# Patient Record
Sex: Male | Born: 1975 | Race: Black or African American | Hispanic: No | Marital: Married | Smoking: Current every day smoker
Health system: Southern US, Community
[De-identification: ages and names within clinical notes are randomized; demographics above are authoritative.]

## PROBLEM LIST (undated history)

## (undated) DIAGNOSIS — M5126 Other intervertebral disc displacement, lumbar region: Secondary | ICD-10-CM

## (undated) DIAGNOSIS — I10 Essential (primary) hypertension: Secondary | ICD-10-CM

## (undated) HISTORY — PX: NO PAST SURGERIES: SHX2092

---

## 2016-03-31 ENCOUNTER — Ambulatory Visit: Payer: Self-pay | Admitting: Orthopedic Surgery

## 2016-03-31 NOTE — H&P (Signed)
Valen Gillison is an 40 y.o. male.   Chief Complaint: back and L leg pain HPI: The patient is a 40 year old male who presents with back pain. The patient reports low back symptoms including pain which began 6 week(s) (6 days) ago following a specific injury (stepped off of a forklift. DOI 01/19/16). The pain radiates to the left thigh. The patient describes the pain as aching. The patient describes the severity of their symptoms as moderate in severity. Current treatment includes opioid analgesics (Hydrocodone) and muscle relaxants (Cyclobenzaprine and Lyrica prn). Prior to being seen today the patient was previously evaluated by Raenette Rover, PA-C with [facility]. Past evaluation has included MRI of the lumbar spine. Past treatment has included opioid analgesics, muscle relaxants, corticosteroids, activity modification, ice packs and heating pad. Note for "Back pain": The patient states that he did experience left low back, buttock, and left leg pain initially, but the medication helped with most of that pain. The patient is out of work. NCM Angelia Smith.  Khylen Riolo is seven weeks status post an injury. He presents here with Karl Ito, his case manager. He is apparently at work and he was lifting. He was getting off his forklift, twisted it, had acute pain that radiated down into his leg. He was seen by Raenette Rover, PA at [facility] in [place]. He was given a prednisone pack and it was diagnosed with sciatica. Due to persistent symptoms, he was directly sent for an MRI which demonstrated a moderately large foraminal disc protrusion at 4-5 with compression of the four root. Facet arthritis was noted at 4-5. This is at [facility] radiology. He was referred for surgical evaluation as he had a persistent weakness. He has noted weakness, numbness and atrophy of his leg. He presents here with his wife and Senaida Lange, his case Production designer, theatre/television/film.  REVIEW OF SYSTEMS Review of  systems is negative for fevers, chest pain, shortness of breath, unexplained recent weight loss, loss of bowel or bladder function, burning with urination, joint swelling, rashes, weakness or numbness, difficulty with balance, easy bruising, excessive thirst or frequent urination.  He had a history of back injury in the past, but it was resolved. He was working. He has been there for a long period of time. He is quite pleased with his job.  Past Medical Hx Gout  High blood pressure   Allergies (April C. Webb; 03/07/2016 9:35 AM) No Known Drug Allergies [03/07/2016]:  Family History (April C. Webb; 03/07/2016 9:25 AM) Diabetes Mellitus  Brother, Father. Hypertension  Brother, Mother.  Social History (April C. Webb; 03/07/2016 9:25 AM) Children  5 or more Current drinker  03/07/2016: Currently drinks beer only occasionally per week Current work status  working full time Exercise  Exercises daily; does running / walking Living situation  live with spouse Marital status  married No history of drug/alcohol rehab  Not under pain contract  Number of flights of stairs before winded  greater than 5 Tobacco / smoke exposure  03/07/2016: yes outdoors only Tobacco use  Current every day smoker. 03/07/2016: smoke(d) 1 pack(s) per day  Medication History (April C. Webb; 03/07/2016 9:43 AM) Labetalol HCl (100MG  Tablet, Oral) Active. Hydrocodone-Acetaminophen (5-325MG  Tablet, Oral) Active. Cyclobenzaprine HCl (5MG  Tablet, Oral) Active. Lyrica (Oral) Specific strength unknown - Active.  No pertinent surgical hx Review of Systems  Constitutional: Negative.   HENT: Negative.   Eyes: Negative.   Respiratory: Negative.   Cardiovascular: Negative.   Gastrointestinal: Negative.  Genitourinary: Negative.   Musculoskeletal: Positive for back pain.  Skin: Negative.   Neurological: Positive for sensory change and focal weakness.  Psychiatric/Behavioral: Negative.     There were  no vitals taken for this visit. Physical Exam  Constitutional: He is oriented to person, place, and time. He appears well-developed and well-nourished. He appears distressed.  HENT:  Head: Normocephalic.  Eyes: Pupils are equal, round, and reactive to light.  Neck: Normal range of motion.  Cardiovascular: Normal rate.   Respiratory: Effort normal.  GI: Soft.  Musculoskeletal:  On exam, moderate distress. Walks with an antalgic gait. Mood and affect is appropriate. He has visible atrophy of the left quad. He has an absent knee reflex, decreased sensation in L4 dermatome. Hip flexor weakness as well as quad weakness. Tibialis anterior is slightly weak. Global limited range of motion lumbar spine.  Lumbar spine exam reveals no evidence of soft tissue swelling, deformity or skin ecchymosis. On palpation there is no tenderness of the lumbar spine. No flank pain with percussion. The abdomen is soft and nontender. Nontender over the trochanters. No cellulitis or lymphadenopathy.  Good range of motion of the lumbar spine without associated pain. Straight leg raise is negative. Motor is 5/5 including EHL, tibialis anterior, plantar flexion, quadriceps and hamstrings. Patient is normoreflexic. There is no Babinski or clonus. Sensory exam is intact to light touch. Patient has good distal pulses. No DVT. No pain and normal range of motion without instability of the hips, knees and ankles.  Neurological: He is alert and oriented to person, place, and time. He displays abnormal reflex.    Three view radiographs, AP, lateral, flexion extension demonstrates a mild scoliosis. Slight offset of 4-5 which may be in rotation without instability in flexion and extension.  [facility] demonstrates a foraminal disc herniation L4-5 compressing the four root due to facet hypertrophy.  The patient also has tried physical therapy at seven weeks status post.  Assessment/Plan HNP L4-5  left Subacute L4 radiculopathy with a severe myotomal weakness and dermatomal dysesthesias, quad atrophy. He has quad atrophy approximately 5 cm less on the affected side than the right.  I had an extensive discussion concerning his current pathology, relevant anatomy and treatment options. His pain is less. I do feel it is secondary to significant compression of the four root. With his severe quadriceps atrophy, weakness, absent reflex, numbness at seven weeks status post and the presence of a neural compressive lesion, I have recommended urgent consideration of decompression, micro lumbar decompression at L4-5 in the left.  I had an extensive discussion of the risks and benefits of the lumbar decompression with the patient including bleeding, infection, damage to neurovascular structures, epidural fibrosis, CSF leak requiring repair. We also discussed increase in pain, adjacent segment disease, recurrent disc herniation, need for future surgery including repeat decompression and/or fusion. We also discussed risks of postoperative hematoma, paralysis, anesthetic complications including DVT, PE, death, cardiopulmonary dysfunction. In addition, the perioperative and postoperative courses were discussed in detail including the rehabilitative time and return to functional activity and work. I provided the patient with an illustrated handout and utilized the appropriate surgical models.  He has no history of DVT, myocardial infarction, diabetes. No history of CVA. Taking hydrocodone, cyclobenzaprine. No back pain or no problems prior to that. We discussed the possibly of the recurrent disc herniation, possibility of fusion in the future. In the interim, I would recommend that he stay out as he has severe weakness and  the risk for his quad giving out and injury. I think this is high and I feel it would be a significant risk factor for that. He would like to proceed as soon as possible. I appreciate kind referral  by Karl Ito, his case manager. We would proceed as soon as possible.  We specifically discussed the possibility that this may have continued pain and extended recovery given the given the extent of that at this point in time, normally overnight in the hospital, two weeks of suture removal, six weeks of physical therapy and then possibility of a light duty. Normally three months of maximum medical improvement pending upon the recovery of the nerve root. Hopefully, we would get some recovery and return of his quadriceps function. I appreciate the kind referral.  Plan microlumbar decompression L4-5 left  Mavis Gravelle, Dayna Barker., PA-C for Dr Shelle Iron 03/31/2016, 11:48 AM

## 2016-04-04 ENCOUNTER — Ambulatory Visit (HOSPITAL_COMMUNITY)
Admission: RE | Admit: 2016-04-04 | Discharge: 2016-04-04 | Disposition: A | Discharge status: Home / Self Care | Payer: Worker's Compensation | Source: Ambulatory Visit | Attending: Orthopedic Surgery | Admitting: Orthopedic Surgery

## 2016-04-04 ENCOUNTER — Encounter (HOSPITAL_COMMUNITY)
Admission: RE | Admit: 2016-04-04 | Discharge: 2016-04-04 | Disposition: A | Discharge status: Home / Self Care | Payer: Worker's Compensation | Source: Ambulatory Visit | Attending: Specialist | Admitting: Specialist

## 2016-04-04 ENCOUNTER — Encounter (HOSPITAL_COMMUNITY): Payer: Self-pay

## 2016-04-04 DIAGNOSIS — M4696 Unspecified inflammatory spondylopathy, lumbar region: Secondary | ICD-10-CM | POA: Diagnosis not present

## 2016-04-04 DIAGNOSIS — F17200 Nicotine dependence, unspecified, uncomplicated: Secondary | ICD-10-CM | POA: Diagnosis not present

## 2016-04-04 DIAGNOSIS — I10 Essential (primary) hypertension: Secondary | ICD-10-CM | POA: Diagnosis not present

## 2016-04-04 DIAGNOSIS — M5126 Other intervertebral disc displacement, lumbar region: Secondary | ICD-10-CM

## 2016-04-04 DIAGNOSIS — Z79891 Long term (current) use of opiate analgesic: Secondary | ICD-10-CM | POA: Diagnosis not present

## 2016-04-04 DIAGNOSIS — M4806 Spinal stenosis, lumbar region: Secondary | ICD-10-CM | POA: Diagnosis not present

## 2016-04-04 DIAGNOSIS — M2578 Osteophyte, vertebrae: Secondary | ICD-10-CM | POA: Diagnosis not present

## 2016-04-04 DIAGNOSIS — Z79899 Other long term (current) drug therapy: Secondary | ICD-10-CM | POA: Diagnosis not present

## 2016-04-04 HISTORY — DX: Other intervertebral disc displacement, lumbar region: M51.26

## 2016-04-04 HISTORY — DX: Essential (primary) hypertension: I10

## 2016-04-04 LAB — BASIC METABOLIC PANEL
Anion gap: 6 (ref 5–15)
BUN: 20 mg/dL (ref 6–20)
CALCIUM: 9.9 mg/dL (ref 8.9–10.3)
CO2: 27 mmol/L (ref 22–32)
CREATININE: 1.07 mg/dL (ref 0.61–1.24)
Chloride: 104 mmol/L (ref 101–111)
GFR calc non Af Amer: >60 mL/min (ref >60)
GLUCOSE: 112 mg/dL — AB (ref 65–99)
Potassium: 4.4 mmol/L (ref 3.5–5.1)
Sodium: 137 mmol/L (ref 135–145)

## 2016-04-04 LAB — SURGICAL PCR SCREEN
MRSA, PCR: NEGATIVE
Staphylococcus aureus: NEGATIVE

## 2016-04-04 LAB — CBC
HCT: 45.4 % (ref 39.0–52.0)
Hemoglobin: 14.9 g/dL (ref 13.0–17.0)
MCH: 29.3 pg (ref 26.0–34.0)
MCHC: 32.8 g/dL (ref 30.0–36.0)
MCV: 89.2 fL (ref 78.0–100.0)
PLATELETS: 239 10*3/uL (ref 150–400)
RBC: 5.09 MIL/uL (ref 4.22–5.81)
RDW: 12.8 % (ref 11.5–15.5)
WBC: 6.7 10*3/uL (ref 4.0–10.5)

## 2016-04-04 LAB — ABO/RH: ABO/RH(D): B POS

## 2016-04-04 NOTE — Patient Instructions (Addendum)
Collin Lloyd  04/04/2016   Your procedure is scheduled on: 04-05-16  [facility]  [facility] 3rd floor to  Short Stay Center at 900 AM.  Call this number if you have problems the morning of surgery [phone]   Remember: ONLY 1 PERSON MAY GO WITH YOU TO SHORT STAY TO GET  READY MORNING OF YOUR SURGERY.  Do not eat food or drink liquids :After Midnight.     Take these medicines the morning of surgery with A SIP OF WATER: Labetalol, gemfibrozil              You may not have any metal on your body including hair pins and              piercings  Do not wear jewelry, make-up, lotions, powders or perfumes, deodorant             Do not wear nail polish.  Do not shave  48 hours prior to surgery.              Men may shave face and neck.   Do not bring valuables to the hospital. [place] IS NOT             RESPONSIBLE   FOR VALUABLES.  Contacts, dentures or bridgework may not be worn into surgery.  Leave suitcase in the car. After surgery it may be brought to your room.                Please read over the following fact sheets you were given: _____________________________________________________________________             [facility] - Preparing for Surgery Before surgery, you can play an important role.  Because skin is not sterile, your skin needs to be as free of germs as possible.  You can reduce the number of germs on your skin by washing with CHG (chlorahexidine gluconate) soap before surgery.  CHG is an antiseptic cleaner which kills germs and bonds with the skin to continue killing germs even after washing. Please DO NOT use if you have an allergy to CHG or antibacterial soaps.  If your skin becomes reddened/irritated stop using the CHG and inform your nurse when you arrive at Short Stay. Do not shave (including legs and underarms) for at least 48 hours prior to the first CHG shower.  You may shave your  face/neck. Please follow these instructions carefully:  1.  Shower with CHG Soap the night before surgery and the  morning of Surgery.  2.  If you choose to wash your hair, wash your hair first as usual with your  normal  shampoo.  3.  After you shampoo, rinse your hair and body thoroughly to remove the  shampoo.                           4.  Use CHG as you would any other liquid soap.  You can apply chg directly  to the skin and wash                       Gently with a scrungie or clean washcloth.  5.  Apply the CHG Soap to your body ONLY FROM THE NECK DOWN.   Do not use on face/ open  Wound or open sores. Avoid contact with eyes, ears mouth and genitals (private parts).                       Wash face,  Genitals (private parts) with your normal soap.             6.  Wash thoroughly, paying special attention to the area where your surgery  will be performed.  7.  Thoroughly rinse your body with warm water from the neck down.  8.  DO NOT shower/wash with your normal soap after using and rinsing off  the CHG Soap.                9.  Pat yourself dry with a clean towel.            10.  Wear clean pajamas.            11.  Place clean sheets on your bed the night of your first shower and do not  sleep with pets. Day of Surgery : Do not apply any lotions/deodorants the morning of surgery.  Please wear clean clothes to the hospital/surgery center.  FAILURE TO FOLLOW THESE INSTRUCTIONS MAY RESULT IN THE CANCELLATION OF YOUR SURGERY PATIENT SIGNATURE_________________________________  NURSE SIGNATURE__________________________________  ________________________________________________________________________   Adam Phenix  An incentive spirometer is a tool that can help keep your lungs clear and active. This tool measures how well you are filling your lungs with each breath. Taking long deep breaths may help reverse or decrease the chance of developing breathing  (pulmonary) problems (especially infection) following:  A long period of time when you are unable to move or be active. BEFORE THE PROCEDURE   If the spirometer includes an indicator to show your best effort, your nurse or respiratory therapist will set it to a desired goal.  If possible, sit up straight or lean slightly forward. Try not to slouch.  Hold the incentive spirometer in an upright position. INSTRUCTIONS FOR USE  1. Sit on the edge of your bed if possible, or sit up as far as you can in bed or on a chair. 2. Hold the incentive spirometer in an upright position. 3. Breathe out normally. 4. Place the mouthpiece in your mouth and seal your lips tightly around it. 5. Breathe in slowly and as deeply as possible, raising the piston or the ball toward the top of the column. 6. Hold your breath for 3-5 seconds or for as long as possible. Allow the piston or ball to fall to the bottom of the column. 7. Remove the mouthpiece from your mouth and breathe out normally. 8. Rest for a few seconds and repeat Steps 1 through 7 at least 10 times every 1-2 hours when you are awake. Take your time and take a few normal breaths between deep breaths. 9. The spirometer may include an indicator to show your best effort. Use the indicator as a goal to work toward during each repetition. 10. After each set of 10 deep breaths, practice coughing to be sure your lungs are clear. If you have an incision (the cut made at the time of surgery), support your incision when coughing by placing a pillow or rolled up towels firmly against it. Once you are able to get out of bed, walk around indoors and cough well. You may stop using the incentive spirometer when instructed by your caregiver.  RISKS AND COMPLICATIONS  Take your time so you do not get  dizzy or light-headed.  If you are in pain, you may need to take or ask for pain medication before doing incentive spirometry. It is harder to take a deep breath if you  are having pain. AFTER USE  Rest and breathe slowly and easily.  It can be helpful to keep track of a log of your progress. Your caregiver can provide you with a simple table to help with this. If you are using the spirometer at home, follow these instructions: [place] IF:   You are having difficultly using the spirometer.  You have trouble using the spirometer as often as instructed.  Your pain medication is not giving enough relief while using the spirometer.  You develop fever of 100.5 F (38.1 C) or higher. SEEK IMMEDIATE MEDICAL CARE IF:   You cough up bloody sputum that had not been present before.  You develop fever of 102 F (38.9 C) or greater.  You develop worsening pain at or near the incision site. MAKE SURE YOU:   Understand these instructions.  Will watch your condition.  Will get help right away if you are not doing well or get worse. Document Released: 11/20/2006 Document Revised: 10/02/2011 Document Reviewed: 01/21/2007 [facility] Patient Information 2014 [place], [place].   ________________________________________________________________________

## 2016-04-05 ENCOUNTER — Ambulatory Visit (HOSPITAL_COMMUNITY): Payer: Worker's Compensation

## 2016-04-05 ENCOUNTER — Encounter (HOSPITAL_COMMUNITY): Payer: Self-pay | Admitting: *Deleted

## 2016-04-05 ENCOUNTER — Ambulatory Visit (HOSPITAL_COMMUNITY)
Admission: RE | Admit: 2016-04-05 | Discharge: 2016-04-06 | Disposition: A | Discharge status: Home / Self Care | Payer: Worker's Compensation | Source: Ambulatory Visit | Attending: Specialist | Admitting: Specialist

## 2016-04-05 ENCOUNTER — Ambulatory Visit (HOSPITAL_COMMUNITY): Payer: Worker's Compensation | Admitting: Certified Registered Nurse Anesthetist

## 2016-04-05 ENCOUNTER — Encounter (HOSPITAL_COMMUNITY)
Admission: RE | Disposition: A | Discharge status: Home / Self Care | Payer: Self-pay | Source: Ambulatory Visit | Attending: Specialist

## 2016-04-05 DIAGNOSIS — F17200 Nicotine dependence, unspecified, uncomplicated: Secondary | ICD-10-CM | POA: Insufficient documentation

## 2016-04-05 DIAGNOSIS — M2578 Osteophyte, vertebrae: Secondary | ICD-10-CM | POA: Diagnosis not present

## 2016-04-05 DIAGNOSIS — M4696 Unspecified inflammatory spondylopathy, lumbar region: Secondary | ICD-10-CM | POA: Diagnosis not present

## 2016-04-05 DIAGNOSIS — Z79899 Other long term (current) drug therapy: Secondary | ICD-10-CM | POA: Insufficient documentation

## 2016-04-05 DIAGNOSIS — M4806 Spinal stenosis, lumbar region: Secondary | ICD-10-CM | POA: Diagnosis not present

## 2016-04-05 DIAGNOSIS — M48061 Spinal stenosis, lumbar region without neurogenic claudication: Secondary | ICD-10-CM | POA: Diagnosis present

## 2016-04-05 DIAGNOSIS — Z9889 Other specified postprocedural states: Secondary | ICD-10-CM

## 2016-04-05 DIAGNOSIS — M5126 Other intervertebral disc displacement, lumbar region: Secondary | ICD-10-CM | POA: Diagnosis not present

## 2016-04-05 DIAGNOSIS — I10 Essential (primary) hypertension: Secondary | ICD-10-CM | POA: Insufficient documentation

## 2016-04-05 DIAGNOSIS — Z419 Encounter for procedure for purposes other than remedying health state, unspecified: Secondary | ICD-10-CM

## 2016-04-05 DIAGNOSIS — Z79891 Long term (current) use of opiate analgesic: Secondary | ICD-10-CM | POA: Insufficient documentation

## 2016-04-05 HISTORY — PX: LUMBAR LAMINECTOMY/DECOMPRESSION MICRODISCECTOMY: SHX5026

## 2016-04-05 LAB — TYPE AND SCREEN
ABO/RH(D): B POS
Antibody Screen: NEGATIVE

## 2016-04-05 SURGERY — LUMBAR LAMINECTOMY/DECOMPRESSION MICRODISCECTOMY
Anesthesia: General | Site: Back | Laterality: Left

## 2016-04-05 MED ORDER — CEFAZOLIN SODIUM-DEXTROSE 2-4 GM/100ML-% IV SOLN
2.0000 g | INTRAVENOUS | Status: AC
  Administered 2016-04-05: 2 g via INTRAVENOUS
  Filled 2016-04-05: qty 100

## 2016-04-05 MED ORDER — SODIUM CHLORIDE 0.9 % IV SOLN
INTRAVENOUS | Status: DC | PRN
  Administered 2016-04-05: 50 ug/min via INTRAVENOUS

## 2016-04-05 MED ORDER — SODIUM CHLORIDE 0.9 % IR SOLN
Status: AC
  Filled 2016-04-05: qty 1

## 2016-04-05 MED ORDER — MEPERIDINE HCL 50 MG/ML IJ SOLN: 6.2500 mg | INTRAMUSCULAR | Status: DC | PRN

## 2016-04-05 MED ORDER — RISAQUAD PO CAPS
1.0000 | ORAL_CAPSULE | Freq: Every day | ORAL | Status: DC
  Administered 2016-04-06: 1 via ORAL
  Filled 2016-04-05: qty 1

## 2016-04-05 MED ORDER — CEFAZOLIN SODIUM-DEXTROSE 2-4 GM/100ML-% IV SOLN
2.0000 g | Freq: Three times a day (TID) | INTRAVENOUS | Status: DC
  Administered 2016-04-05 – 2016-04-06 (×2): 2 g via INTRAVENOUS
  Filled 2016-04-05 (×2): qty 100

## 2016-04-05 MED ORDER — EPHEDRINE SULFATE 50 MG/ML IJ SOLN
INTRAMUSCULAR | Status: DC | PRN
  Administered 2016-04-05 (×5): 10 mg via INTRAVENOUS

## 2016-04-05 MED ORDER — LIP MEDEX EX OINT
TOPICAL_OINTMENT | CUTANEOUS | Status: AC
  Filled 2016-04-05: qty 7

## 2016-04-05 MED ORDER — ONDANSETRON HCL 4 MG/2ML IJ SOLN
INTRAMUSCULAR | Status: AC
  Filled 2016-04-05: qty 2

## 2016-04-05 MED ORDER — LIDOCAINE 2% (20 MG/ML) 5 ML SYRINGE
INTRAMUSCULAR | Status: AC
  Filled 2016-04-05: qty 5

## 2016-04-05 MED ORDER — AMLODIPINE BESYLATE 10 MG PO TABS
10.0000 mg | ORAL_TABLET | Freq: Once | ORAL | Status: AC
  Administered 2016-04-05: 10 mg via ORAL
  Filled 2016-04-05 (×2): qty 1

## 2016-04-05 MED ORDER — MAGNESIUM CITRATE PO SOLN: 1.0000 | Freq: Once | ORAL | Status: DC | PRN

## 2016-04-05 MED ORDER — EPHEDRINE 5 MG/ML INJ
INTRAVENOUS | Status: AC
  Filled 2016-04-05: qty 20

## 2016-04-05 MED ORDER — LABETALOL HCL 200 MG PO TABS
200.0000 mg | ORAL_TABLET | Freq: Two times a day (BID) | ORAL | Status: DC
  Administered 2016-04-05 – 2016-04-06 (×2): 200 mg via ORAL
  Filled 2016-04-05 (×2): qty 1

## 2016-04-05 MED ORDER — ALUM & MAG HYDROXIDE-SIMETH 200-200-20 MG/5ML PO SUSP: 30.0000 mL | Freq: Four times a day (QID) | ORAL | Status: DC | PRN

## 2016-04-05 MED ORDER — HYDROMORPHONE HCL 1 MG/ML IJ SOLN: 0.5000 mg | INTRAMUSCULAR | Status: DC | PRN

## 2016-04-05 MED ORDER — ONDANSETRON HCL 4 MG/2ML IJ SOLN
4.0000 mg | INTRAMUSCULAR | Status: DC | PRN
  Administered 2016-04-05: 4 mg via INTRAVENOUS
  Filled 2016-04-05: qty 2

## 2016-04-05 MED ORDER — AMLODIPINE BESYLATE 10 MG PO TABS
10.0000 mg | ORAL_TABLET | Freq: Every day | ORAL | Status: DC
  Administered 2016-04-06: 10 mg via ORAL
  Filled 2016-04-05: qty 1

## 2016-04-05 MED ORDER — CEFAZOLIN SODIUM-DEXTROSE 2-4 GM/100ML-% IV SOLN
INTRAVENOUS | Status: AC
  Filled 2016-04-05: qty 100

## 2016-04-05 MED ORDER — IRBESARTAN 150 MG PO TABS
300.0000 mg | ORAL_TABLET | Freq: Every day | ORAL | Status: DC
  Administered 2016-04-06: 300 mg via ORAL
  Filled 2016-04-05: qty 2

## 2016-04-05 MED ORDER — PHENYLEPHRINE HCL 10 MG/ML IJ SOLN
INTRAMUSCULAR | Status: AC
  Filled 2016-04-05: qty 1

## 2016-04-05 MED ORDER — HYDROMORPHONE HCL 1 MG/ML IJ SOLN
INTRAMUSCULAR | Status: AC
  Filled 2016-04-05: qty 1

## 2016-04-05 MED ORDER — KCL IN DEXTROSE-NACL 20-5-0.45 MEQ/L-%-% IV SOLN
INTRAVENOUS | Status: DC
  Administered 2016-04-05: 18:00:00 via INTRAVENOUS
  Filled 2016-04-05: qty 1000

## 2016-04-05 MED ORDER — PROPOFOL 10 MG/ML IV BOLUS
INTRAVENOUS | Status: DC | PRN
  Administered 2016-04-05: 200 mg via INTRAVENOUS

## 2016-04-05 MED ORDER — CLINDAMYCIN PHOSPHATE 900 MG/50ML IV SOLN
900.0000 mg | Freq: Once | INTRAVENOUS | Status: AC
  Administered 2016-04-05: 900 mg via INTRAVENOUS

## 2016-04-05 MED ORDER — LIDOCAINE HCL (CARDIAC) 20 MG/ML IV SOLN
INTRAVENOUS | Status: DC | PRN
  Administered 2016-04-05: 100 mg via INTRAVENOUS

## 2016-04-05 MED ORDER — METHOCARBAMOL 1000 MG/10ML IJ SOLN
500.0000 mg | Freq: Four times a day (QID) | INTRAVENOUS | Status: DC | PRN
  Administered 2016-04-05: 500 mg via INTRAVENOUS
  Filled 2016-04-05: qty 5
  Filled 2016-04-05: qty 550

## 2016-04-05 MED ORDER — HYDROCODONE-ACETAMINOPHEN 5-325 MG PO TABS: 1.0000 | ORAL_TABLET | ORAL | Status: DC | PRN

## 2016-04-05 MED ORDER — ACETAMINOPHEN 325 MG PO TABS
650.0000 mg | ORAL_TABLET | ORAL | Status: DC | PRN
  Administered 2016-04-06: 650 mg via ORAL
  Filled 2016-04-05: qty 2

## 2016-04-05 MED ORDER — MIDAZOLAM HCL 2 MG/2ML IJ SOLN
INTRAMUSCULAR | Status: AC
  Filled 2016-04-05: qty 2

## 2016-04-05 MED ORDER — ESMOLOL HCL 100 MG/10ML IV SOLN
INTRAVENOUS | Status: DC | PRN
Start: 2016-04-05 — End: 2016-04-05
  Administered 2016-04-05 (×2): 10 mg via INTRAVENOUS

## 2016-04-05 MED ORDER — FENTANYL CITRATE (PF) 100 MCG/2ML IJ SOLN
INTRAMUSCULAR | Status: AC
  Filled 2016-04-05: qty 2

## 2016-04-05 MED ORDER — SUCCINYLCHOLINE CHLORIDE 20 MG/ML IJ SOLN: INTRAMUSCULAR | Status: DC | PRN

## 2016-04-05 MED ORDER — ROCURONIUM BROMIDE 10 MG/ML (PF) SYRINGE
PREFILLED_SYRINGE | INTRAVENOUS | Status: AC
  Filled 2016-04-05: qty 10

## 2016-04-05 MED ORDER — PROPOFOL 10 MG/ML IV BOLUS
INTRAVENOUS | Status: AC
  Filled 2016-04-05: qty 20

## 2016-04-05 MED ORDER — PHENYLEPHRINE 40 MCG/ML (10ML) SYRINGE FOR IV PUSH (FOR BLOOD PRESSURE SUPPORT)
PREFILLED_SYRINGE | INTRAVENOUS | Status: AC
  Filled 2016-04-05: qty 10

## 2016-04-05 MED ORDER — METHOCARBAMOL 500 MG PO TABS
500.0000 mg | ORAL_TABLET | Freq: Four times a day (QID) | ORAL | Status: DC | PRN
  Administered 2016-04-06 (×2): 500 mg via ORAL
  Filled 2016-04-05 (×2): qty 1

## 2016-04-05 MED ORDER — BISACODYL 5 MG PO TBEC: 5.0000 mg | DELAYED_RELEASE_TABLET | Freq: Every day | ORAL | Status: DC | PRN

## 2016-04-05 MED ORDER — SODIUM CHLORIDE 0.9 % IJ SOLN
INTRAMUSCULAR | Status: AC
  Filled 2016-04-05: qty 10

## 2016-04-05 MED ORDER — DEXAMETHASONE SODIUM PHOSPHATE 10 MG/ML IJ SOLN
INTRAMUSCULAR | Status: DC | PRN
  Administered 2016-04-05: 10 mg via INTRAVENOUS

## 2016-04-05 MED ORDER — OXYCODONE-ACETAMINOPHEN 5-325 MG PO TABS
1.0000 | ORAL_TABLET | ORAL | Status: DC | PRN
  Administered 2016-04-05: 1 via ORAL
  Administered 2016-04-06: 2 via ORAL
  Administered 2016-04-06: 1 via ORAL
  Administered 2016-04-06 (×2): 2 via ORAL
  Filled 2016-04-05 (×2): qty 2
  Filled 2016-04-05: qty 1
  Filled 2016-04-05 (×2): qty 2

## 2016-04-05 MED ORDER — SUGAMMADEX SODIUM 200 MG/2ML IV SOLN
INTRAVENOUS | Status: AC
  Filled 2016-04-05: qty 2

## 2016-04-05 MED ORDER — POLYETHYLENE GLYCOL 3350 17 G PO PACK: 17.0000 g | PACK | Freq: Every day | ORAL | Status: DC | PRN

## 2016-04-05 MED ORDER — ESMOLOL HCL 100 MG/10ML IV SOLN
INTRAVENOUS | Status: AC
  Filled 2016-04-05: qty 10

## 2016-04-05 MED ORDER — LACTATED RINGERS IV SOLN
INTRAVENOUS | Status: DC
  Administered 2016-04-05 (×2): via INTRAVENOUS

## 2016-04-05 MED ORDER — BUPIVACAINE-EPINEPHRINE 0.5% -1:200000 IJ SOLN
INTRAMUSCULAR | Status: AC
  Filled 2016-04-05: qty 1

## 2016-04-05 MED ORDER — FENTANYL CITRATE (PF) 100 MCG/2ML IJ SOLN
INTRAMUSCULAR | Status: DC | PRN
  Administered 2016-04-05 (×2): 50 ug via INTRAVENOUS
  Administered 2016-04-05: 100 ug via INTRAVENOUS
  Administered 2016-04-05 (×2): 50 ug via INTRAVENOUS

## 2016-04-05 MED ORDER — SUGAMMADEX SODIUM 200 MG/2ML IV SOLN
INTRAVENOUS | Status: DC | PRN
  Administered 2016-04-05: 200 mg via INTRAVENOUS

## 2016-04-05 MED ORDER — CLINDAMYCIN PHOSPHATE 900 MG/50ML IV SOLN
INTRAVENOUS | Status: AC
  Filled 2016-04-05: qty 50

## 2016-04-05 MED ORDER — HYDROMORPHONE HCL 1 MG/ML IJ SOLN
0.2500 mg | INTRAMUSCULAR | Status: DC | PRN
  Administered 2016-04-05: 0.5 mg via INTRAVENOUS
  Administered 2016-04-05 (×2): 0.25 mg via INTRAVENOUS

## 2016-04-05 MED ORDER — MENTHOL 3 MG MT LOZG: 1.0000 | LOZENGE | OROMUCOSAL | Status: DC | PRN

## 2016-04-05 MED ORDER — MIDAZOLAM HCL 5 MG/5ML IJ SOLN
INTRAMUSCULAR | Status: DC | PRN
  Administered 2016-04-05: 2 mg via INTRAVENOUS

## 2016-04-05 MED ORDER — ACETAMINOPHEN 650 MG RE SUPP: 650.0000 mg | RECTAL | Status: DC | PRN

## 2016-04-05 MED ORDER — ROCURONIUM BROMIDE 100 MG/10ML IV SOLN
INTRAVENOUS | Status: DC | PRN
  Administered 2016-04-05: 50 mg via INTRAVENOUS

## 2016-04-05 MED ORDER — BUPIVACAINE-EPINEPHRINE 0.5% -1:200000 IJ SOLN
INTRAMUSCULAR | Status: DC | PRN
  Administered 2016-04-05: 10 mL

## 2016-04-05 MED ORDER — PHENOL 1.4 % MT LIQD
1.0000 | OROMUCOSAL | Status: DC | PRN
Start: 2016-04-05 — End: 2016-04-06
  Filled 2016-04-05: qty 177

## 2016-04-05 MED ORDER — ONDANSETRON HCL 4 MG/2ML IJ SOLN: 4.0000 mg | Freq: Once | INTRAMUSCULAR | Status: DC | PRN

## 2016-04-05 MED ORDER — AMLODIPINE BESYLATE-VALSARTAN 10-320 MG PO TABS: 1.0000 | ORAL_TABLET | Freq: Every day | ORAL | Status: DC

## 2016-04-05 MED ORDER — CHLORHEXIDINE GLUCONATE 4 % EX LIQD: 60.0000 mL | Freq: Once | CUTANEOUS | Status: DC

## 2016-04-05 MED ORDER — FENTANYL CITRATE (PF) 100 MCG/2ML IJ SOLN
INTRAMUSCULAR | Status: AC
  Filled 2016-04-05: qty 4

## 2016-04-05 MED ORDER — POLYMYXIN B SULFATE 500000 UNITS IJ SOLR
INTRAMUSCULAR | Status: DC | PRN
  Administered 2016-04-05: 1000 mL

## 2016-04-05 MED ORDER — PHENYLEPHRINE HCL 10 MG/ML IJ SOLN
INTRAMUSCULAR | Status: DC | PRN
  Administered 2016-04-05 (×4): 80 ug via INTRAVENOUS

## 2016-04-05 MED ORDER — DOCUSATE SODIUM 100 MG PO CAPS
100.0000 mg | ORAL_CAPSULE | Freq: Two times a day (BID) | ORAL | Status: DC
  Administered 2016-04-05 – 2016-04-06 (×2): 100 mg via ORAL
  Filled 2016-04-05 (×2): qty 1

## 2016-04-05 MED ORDER — DEXAMETHASONE SODIUM PHOSPHATE 10 MG/ML IJ SOLN
INTRAMUSCULAR | Status: AC
  Filled 2016-04-05: qty 1

## 2016-04-05 MED ORDER — ONDANSETRON HCL 4 MG/2ML IJ SOLN
INTRAMUSCULAR | Status: DC | PRN
  Administered 2016-04-05: 4 mg via INTRAVENOUS

## 2016-04-05 SURGICAL SUPPLY — 45 items
BAG ZIPLOCK 12X15 (MISCELLANEOUS) IMPLANT
CLOSURE WOUND 1/2 X4 (GAUZE/BANDAGES/DRESSINGS) ×1
CLOTH 2% CHLOROHEXIDINE 3PK (PERSONAL CARE ITEMS) ×3 IMPLANT
DRAPE MICROSCOPE LEICA (MISCELLANEOUS) ×6 IMPLANT
DRAPE SHEET LG 3/4 BI-LAMINATE (DRAPES) IMPLANT
DRAPE SURG 17X11 SM STRL (DRAPES) ×3 IMPLANT
DRAPE UTILITY XL STRL (DRAPES) ×3 IMPLANT
DRSG AQUACEL AG ADV 3.5X 4 (GAUZE/BANDAGES/DRESSINGS) ×3 IMPLANT
DRSG AQUACEL AG ADV 3.5X 6 (GAUZE/BANDAGES/DRESSINGS) IMPLANT
DRSG TEGADERM 8X12 (GAUZE/BANDAGES/DRESSINGS) ×3 IMPLANT
DURAPREP 26ML APPLICATOR (WOUND CARE) ×3 IMPLANT
DURASEAL SPINE SEALANT 3ML (MISCELLANEOUS) IMPLANT
ELECT BLADE TIP CTD 4 INCH (ELECTRODE) ×3 IMPLANT
ELECT REM PT RETURN 9FT ADLT (ELECTROSURGICAL) ×3
ELECTRODE REM PT RTRN 9FT ADLT (ELECTROSURGICAL) ×1 IMPLANT
GLOVE BIOGEL PI IND STRL 7.0 (GLOVE) ×3 IMPLANT
GLOVE BIOGEL PI INDICATOR 7.0 (GLOVE) ×6
GLOVE SURG SS PI 7.0 STRL IVOR (GLOVE) ×15 IMPLANT
GLOVE SURG SS PI 7.5 STRL IVOR (GLOVE) ×3 IMPLANT
GLOVE SURG SS PI 8.0 STRL IVOR (GLOVE) ×6 IMPLANT
GOWN STRL REUS W/TWL XL LVL3 (GOWN DISPOSABLE) ×15 IMPLANT
HEMOSTAT SPONGE AVITENE ULTRA (HEMOSTASIS) ×3 IMPLANT
IV CATH 14GX2 1/4 (CATHETERS) IMPLANT
KIT BASIN OR (CUSTOM PROCEDURE TRAY) ×3 IMPLANT
KIT POSITIONING SURG ANDREWS (MISCELLANEOUS) ×3 IMPLANT
MANIFOLD NEPTUNE II (INSTRUMENTS) ×3 IMPLANT
NEEDLE SPNL 18GX3.5 QUINCKE PK (NEEDLE) ×6 IMPLANT
PACK LAMINECTOMY ORTHO (CUSTOM PROCEDURE TRAY) ×3 IMPLANT
PATTIES SURGICAL .5 X.5 (GAUZE/BANDAGES/DRESSINGS) IMPLANT
PATTIES SURGICAL .75X.75 (GAUZE/BANDAGES/DRESSINGS) ×3 IMPLANT
RUBBERBAND STERILE (MISCELLANEOUS) ×6 IMPLANT
SPONGE SURGIFOAM ABS GEL 100 (HEMOSTASIS) ×3 IMPLANT
STAPLER VISISTAT (STAPLE) IMPLANT
STRIP CLOSURE SKIN 1/2X4 (GAUZE/BANDAGES/DRESSINGS) ×2 IMPLANT
SUT NURALON 4 0 TR CR/8 (SUTURE) IMPLANT
SUT PROLENE 3 0 PS 2 (SUTURE) ×3 IMPLANT
SUT VIC AB 1 CT1 27 (SUTURE) ×6
SUT VIC AB 1 CT1 27XBRD ANTBC (SUTURE) ×3 IMPLANT
SUT VIC AB 1-0 CT2 27 (SUTURE) ×9 IMPLANT
SUT VIC AB 2-0 CT1 27 (SUTURE) ×2
SUT VIC AB 2-0 CT1 TAPERPNT 27 (SUTURE) ×1 IMPLANT
SUT VIC AB 2-0 CT2 27 (SUTURE) ×3 IMPLANT
SYR 3ML LL SCALE MARK (SYRINGE) IMPLANT
TOWEL OR 17X26 10 PK STRL BLUE (TOWEL DISPOSABLE) ×3 IMPLANT
YANKAUER SUCT BULB TIP NO VENT (SUCTIONS) ×3 IMPLANT

## 2016-04-05 NOTE — H&P (View-Only) (Signed)
Valen Gillison is an 40 y.o. male.   Chief Complaint: back and L leg pain HPI: The patient is a 40 year old male who presents with back pain. The patient reports low back symptoms including pain which began 6 week(s) (6 days) ago following a specific injury (stepped off of a forklift. DOI 01/19/16). The pain radiates to the left thigh. The patient describes the pain as aching. The patient describes the severity of their symptoms as moderate in severity. Current treatment includes opioid analgesics (Hydrocodone) and muscle relaxants (Cyclobenzaprine and Lyrica prn). Prior to being seen today the patient was previously evaluated by Raenette Rover, PA-C with [facility]. Past evaluation has included MRI of the lumbar spine. Past treatment has included opioid analgesics, muscle relaxants, corticosteroids, activity modification, ice packs and heating pad. Note for "Back pain": The patient states that he did experience left low back, buttock, and left leg pain initially, but the medication helped with most of that pain. The patient is out of work. NCM Angelia Smith.  Khylen Riolo is seven weeks status post an injury. He presents here with Karl Ito, his case manager. He is apparently at work and he was lifting. He was getting off his forklift, twisted it, had acute pain that radiated down into his leg. He was seen by Raenette Rover, PA at [facility] in [place]. He was given a prednisone pack and it was diagnosed with sciatica. Due to persistent symptoms, he was directly sent for an MRI which demonstrated a moderately large foraminal disc protrusion at 4-5 with compression of the four root. Facet arthritis was noted at 4-5. This is at [facility] radiology. He was referred for surgical evaluation as he had a persistent weakness. He has noted weakness, numbness and atrophy of his leg. He presents here with his wife and Senaida Lange, his case Production designer, theatre/television/film.  REVIEW OF SYSTEMS Review of  systems is negative for fevers, chest pain, shortness of breath, unexplained recent weight loss, loss of bowel or bladder function, burning with urination, joint swelling, rashes, weakness or numbness, difficulty with balance, easy bruising, excessive thirst or frequent urination.  He had a history of back injury in the past, but it was resolved. He was working. He has been there for a long period of time. He is quite pleased with his job.  Past Medical Hx Gout  High blood pressure   Allergies (April C. Webb; 03/07/2016 9:35 AM) No Known Drug Allergies [03/07/2016]:  Family History (April C. Webb; 03/07/2016 9:25 AM) Diabetes Mellitus  Brother, Father. Hypertension  Brother, Mother.  Social History (April C. Webb; 03/07/2016 9:25 AM) Children  5 or more Current drinker  03/07/2016: Currently drinks beer only occasionally per week Current work status  working full time Exercise  Exercises daily; does running / walking Living situation  live with spouse Marital status  married No history of drug/alcohol rehab  Not under pain contract  Number of flights of stairs before winded  greater than 5 Tobacco / smoke exposure  03/07/2016: yes outdoors only Tobacco use  Current every day smoker. 03/07/2016: smoke(d) 1 pack(s) per day  Medication History (April C. Webb; 03/07/2016 9:43 AM) Labetalol HCl (100MG  Tablet, Oral) Active. Hydrocodone-Acetaminophen (5-325MG  Tablet, Oral) Active. Cyclobenzaprine HCl (5MG  Tablet, Oral) Active. Lyrica (Oral) Specific strength unknown - Active.  No pertinent surgical hx Review of Systems  Constitutional: Negative.   HENT: Negative.   Eyes: Negative.   Respiratory: Negative.   Cardiovascular: Negative.   Gastrointestinal: Negative.  Genitourinary: Negative.   Musculoskeletal: Positive for back pain.  Skin: Negative.   Neurological: Positive for sensory change and focal weakness.  Psychiatric/Behavioral: Negative.     There were  no vitals taken for this visit. Physical Exam  Constitutional: He is oriented to person, place, and time. He appears well-developed and well-nourished. He appears distressed.  HENT:  Head: Normocephalic.  Eyes: Pupils are equal, round, and reactive to light.  Neck: Normal range of motion.  Cardiovascular: Normal rate.   Respiratory: Effort normal.  GI: Soft.  Musculoskeletal:  On exam, moderate distress. Walks with an antalgic gait. Mood and affect is appropriate. He has visible atrophy of the left quad. He has an absent knee reflex, decreased sensation in L4 dermatome. Hip flexor weakness as well as quad weakness. Tibialis anterior is slightly weak. Global limited range of motion lumbar spine.  Lumbar spine exam reveals no evidence of soft tissue swelling, deformity or skin ecchymosis. On palpation there is no tenderness of the lumbar spine. No flank pain with percussion. The abdomen is soft and nontender. Nontender over the trochanters. No cellulitis or lymphadenopathy.  Good range of motion of the lumbar spine without associated pain. Straight leg raise is negative. Motor is 5/5 including EHL, tibialis anterior, plantar flexion, quadriceps and hamstrings. Patient is normoreflexic. There is no Babinski or clonus. Sensory exam is intact to light touch. Patient has good distal pulses. No DVT. No pain and normal range of motion without instability of the hips, knees and ankles.  Neurological: He is alert and oriented to person, place, and time. He displays abnormal reflex.    Three view radiographs, AP, lateral, flexion extension demonstrates a mild scoliosis. Slight offset of 4-5 which may be in rotation without instability in flexion and extension.  [facility] demonstrates a foraminal disc herniation L4-5 compressing the four root due to facet hypertrophy.  The patient also has tried physical therapy at seven weeks status post.  Assessment/Plan HNP L4-5  left Subacute L4 radiculopathy with a severe myotomal weakness and dermatomal dysesthesias, quad atrophy. He has quad atrophy approximately 5 cm less on the affected side than the right.  I had an extensive discussion concerning his current pathology, relevant anatomy and treatment options. His pain is less. I do feel it is secondary to significant compression of the four root. With his severe quadriceps atrophy, weakness, absent reflex, numbness at seven weeks status post and the presence of a neural compressive lesion, I have recommended urgent consideration of decompression, micro lumbar decompression at L4-5 in the left.  I had an extensive discussion of the risks and benefits of the lumbar decompression with the patient including bleeding, infection, damage to neurovascular structures, epidural fibrosis, CSF leak requiring repair. We also discussed increase in pain, adjacent segment disease, recurrent disc herniation, need for future surgery including repeat decompression and/or fusion. We also discussed risks of postoperative hematoma, paralysis, anesthetic complications including DVT, PE, death, cardiopulmonary dysfunction. In addition, the perioperative and postoperative courses were discussed in detail including the rehabilitative time and return to functional activity and work. I provided the patient with an illustrated handout and utilized the appropriate surgical models.  He has no history of DVT, myocardial infarction, diabetes. No history of CVA. Taking hydrocodone, cyclobenzaprine. No back pain or no problems prior to that. We discussed the possibly of the recurrent disc herniation, possibility of fusion in the future. In the interim, I would recommend that he stay out as he has severe weakness and  the risk for his quad giving out and injury. I think this is high and I feel it would be a significant risk factor for that. He would like to proceed as soon as possible. I appreciate kind referral  by Karl Ito, his case manager. We would proceed as soon as possible.  We specifically discussed the possibility that this may have continued pain and extended recovery given the given the extent of that at this point in time, normally overnight in the hospital, two weeks of suture removal, six weeks of physical therapy and then possibility of a light duty. Normally three months of maximum medical improvement pending upon the recovery of the nerve root. Hopefully, we would get some recovery and return of his quadriceps function. I appreciate the kind referral.  Plan microlumbar decompression L4-5 left  Minh Roanhorse, Dayna Barker., PA-C for Dr Shelle Iron 03/31/2016, 11:48 AM

## 2016-04-05 NOTE — Anesthesia Postprocedure Evaluation (Signed)
Anesthesia Post Note  Patient: Collin Lloyd  Procedure(s) Performed: Procedure(s) (LRB): MICRO LUMBAR DECOMPRESSION L4 - L5 ON THE LEFT (Left)  Patient location during evaluation: PACU Anesthesia Type: General Level of consciousness: awake and alert Pain management: pain level controlled Vital Signs Assessment: post-procedure vital signs reviewed and stable Respiratory status: spontaneous breathing, nonlabored ventilation, respiratory function stable and patient connected to nasal cannula oxygen Cardiovascular status: blood pressure returned to baseline and stable Postop Assessment: no signs of nausea or vomiting Anesthetic complications: no    Last Vitals:  Vitals:   04/05/16 1649 04/05/16 1700  BP: 130/77 130/77  Pulse:  77  Resp: 18 18  Temp: 36.6 C 36.6 C    Last Pain:  Vitals:   04/05/16 1700  TempSrc: Oral  PainSc: 3                  Louis Ivery DAVID

## 2016-04-05 NOTE — Anesthesia Procedure Notes (Signed)
Procedure Name: Intubation Date/Time: 04/05/2016 11:19 AM Performed by: Orest DikesPETERS, Norvel Wenker J Pre-anesthesia Checklist: Patient identified, Emergency Drugs available, Suction available and Patient being monitored Patient Re-evaluated:Patient Re-evaluated prior to inductionOxygen Delivery Method: Circle system utilized Preoxygenation: Pre-oxygenation with 100% oxygen Intubation Type: IV induction Ventilation: Mask ventilation without difficulty Laryngoscope Size: Mac and 4 Grade View: Grade I Tube type: Oral Tube size: 7.5 mm Number of attempts: 1 Airway Equipment and Method: Stylet and Oral airway Placement Confirmation: ETT inserted through vocal cords under direct vision,  positive ETCO2 and breath sounds checked- equal and bilateral Secured at: 21 cm Tube secured with: Tape Dental Injury: Teeth and Oropharynx as per pre-operative assessment

## 2016-04-05 NOTE — Brief Op Note (Signed)
04/05/2016  2:33 PM  PATIENT:  Collin Lloyd  40 y.o. 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  PROCEDURE:  Procedure(s): MICRO LUMBAR DECOMPRESSION L4 - L5 ON THE LEFT (Left)  SURGEON:  Surgeon(s) and Role:    * Jene EveryJeffrey Venba Zenner, MD - Primary  PHYSICIAN ASSISTANT:   ASSISTANTS: Roberts   ANESTHESIA:   general  EBL:  Total I/O In: 1000 [I.V.:1000] Out: 75 [Blood:75]  BLOOD ADMINISTERED:none  DRAINS: none   LOCAL MEDICATIONS USED:  MARCAINE     SPECIMEN:  Source of Specimen:  L45  DISPOSITION OF SPECIMEN:  PATHOLOGY  COUNTS:  YES  TOURNIQUET:  * No tourniquets in log *  DICTATION: .Other Dictation: Dictation Number  [id number]  PLAN OF CARE: Admit for overnight observation  PATIENT DISPOSITION:  PACU - hemodynamically stable.   Delay start of Pharmacological VTE agent (>24hrs) due to surgical blood loss or risk of bleeding: yes

## 2016-04-05 NOTE — Transfer of Care (Signed)
Immediate Anesthesia Transfer of Care Note  Patient: Collin Lloyd  Procedure(s) Performed: Procedure(s): MICRO LUMBAR DECOMPRESSION L4 - L5 ON THE LEFT (Left)  Patient Location: PACU  Anesthesia Type:General  Level of Consciousness:  sedated, patient cooperative and responds to stimulation  Airway & Oxygen Therapy:Patient Spontanous Breathing and Patient connected to face mask oxgen  Post-op Assessment:  Report given to PACU RN and Post -op Vital signs reviewed and stable  Post vital signs:  Reviewed and stable  Last Vitals:  Vitals:   04/05/16 0834 04/05/16 0847  BP: 127/80 127/80  Pulse: 77   Resp: 16   Temp: 36.3 C     Complications: No apparent anesthesia complications

## 2016-04-05 NOTE — Anesthesia Preprocedure Evaluation (Addendum)
Anesthesia Evaluation  Patient identified by MRN, date of birth, ID band Patient awake    Reviewed: Allergy & Precautions, NPO status , Patient's Chart, lab work & pertinent test results  Airway Mallampati: I  TM Distance: >3 FB Neck ROM: Full    Dental   Pulmonary Current Smoker,    Pulmonary exam normal       Cardiovascular hypertension, Pt. on medications Normal cardiovascular exam    Neuro/Psych    GI/Hepatic   Endo/Other    Renal/GU      Musculoskeletal   Abdominal   Peds  Hematology   Anesthesia Other Findings   Reproductive/Obstetrics                             Anesthesia Physical Anesthesia Plan  ASA: II  Anesthesia Plan: General   Post-op Pain Management:    Induction: Intravenous  Airway Management Planned: Oral ETT  Additional Equipment:   Intra-op Plan:   Post-operative Plan: Extubation in OR  Informed Consent: I have reviewed the patients History and Physical, chart, labs and discussed the procedure including the risks, benefits and alternatives for the proposed anesthesia with the patient or authorized representative who has indicated his/her understanding and acceptance.     Plan Discussed with: CRNA and Surgeon  Anesthesia Plan Comments:         Anesthesia Quick Evaluation  

## 2016-04-05 NOTE — Interval H&P Note (Signed)
History and Physical Interval Note:  04/05/2016 10:29 AM  Collin Lloyd  has presented today for surgery, with the diagnosis of HNP L4 -L5 on the left  The various methods of treatment have been discussed with the patient and family. After consideration of risks, benefits and other options for treatment, the patient has consented to  Procedure(s): MICRO LUMBAR DECOMPRESSION L4 - L5 ON THE LEFT (Left) as a surgical intervention .  The patient's history has been reviewed, patient examined, no change in status, stable for surgery.  I have reviewed the patient's chart and labs.  Questions were answered to the patient's satisfaction.     Camara Rosander C

## 2016-04-06 DIAGNOSIS — M4806 Spinal stenosis, lumbar region: Secondary | ICD-10-CM | POA: Diagnosis not present

## 2016-04-06 MED ORDER — METHOCARBAMOL 500 MG PO TABS: 500.0000 mg | ORAL_TABLET | Freq: Four times a day (QID) | ORAL | 0 refills | Status: AC | PRN

## 2016-04-06 MED ORDER — OXYCODONE-ACETAMINOPHEN 5-325 MG PO TABS: 1.0000 | ORAL_TABLET | ORAL | 0 refills | Status: AC | PRN

## 2016-04-06 MED ORDER — POLYETHYLENE GLYCOL 3350 17 G PO PACK: 17.0000 g | PACK | Freq: Every day | ORAL | 0 refills | Status: AC | PRN

## 2016-04-06 MED ORDER — METHOCARBAMOL 500 MG PO TABS: 500.0000 mg | ORAL_TABLET | Freq: Four times a day (QID) | ORAL | 0 refills | Status: DC | PRN

## 2016-04-06 MED ORDER — DOCUSATE SODIUM 100 MG PO CAPS: 100.0000 mg | ORAL_CAPSULE | Freq: Two times a day (BID) | ORAL | 0 refills | Status: AC

## 2016-04-06 MED ORDER — HYDROCODONE-ACETAMINOPHEN 5-325 MG PO TABS: 1.0000 | ORAL_TABLET | ORAL | 0 refills | Status: DC | PRN

## 2016-04-06 NOTE — Discharge Summary (Signed)
Physician Discharge Summary   Patient ID: MYNOR WITKOP MRN: [id number] DOB/AGE: 1976/02/24 40 y.o.  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 HNP (herniated nucleus pulposus), lumbar   . Hypertension    Discharge Diagnoses:   Active Problems:   Spinal stenosis of lumbar region  Procedure:  Procedure(s) (LRB): MICRO LUMBAR DECOMPRESSION L4 - L5 ON THE LEFT (Left)   Consults: None  HPI: This is a pleasant gentleman with predominantly quad weakness on the left secondary to foraminal disk herniation, 4-5.  He had significant atrophy, diminished knee reflex and a quad weakness.  MRI indicating foraminal disk herniation at 4-5, associated disk degeneration.  He did have some foraminal stenosis at 4-5 on the right. Predominant symptoms on the left.  Preoperatively noted that based upon the position, he would lay and he would develop right lower extremity radicular pain.  We discussed predominantly microlumbar decompression at L4-5.  Risks and benefits were discussed including bleeding, infection, damage to neurovascular structures, CSF leakage, epidural fibrosis, adjacent segment disease, need for fusion in the future, DVT, PE, anesthetic complications, etc.     Laboratory Data: Hospital Outpatient Visit on 04/04/2016  Component Date Value Ref Range Status  . Sodium 04/04/2016 137  135 - 145 mmol/L Final  . Potassium 04/04/2016 4.4  3.5 - 5.1 mmol/L Final  . Chloride 04/04/2016 104  101 - 111 mmol/L Final  . CO2 04/04/2016 27  22 - 32 mmol/L Final  . Glucose, Bld 04/04/2016 112* 65 - 99 mg/dL Final  . BUN 04/04/2016 20  6 - 20 mg/dL Final  . Creatinine, Ser 04/04/2016 1.07  0.61 - 1.24 mg/dL Final  . Calcium 04/04/2016 9.9  8.9 - 10.3 mg/dL Final  . GFR calc non Af Amer 04/04/2016 >60  >60 mL/min Final  . GFR calc Af Amer 04/04/2016 >60  >60 mL/min  Final   Comment: (NOTE) The eGFR has been calculated using the CKD EPI equation. This calculation has not been validated in all clinical situations. eGFR's persistently <60 mL/min signify possible Chronic Kidney Disease.   . Anion gap 04/04/2016 6  5 - 15 Final  . WBC 04/04/2016 6.7  4.0 - 10.5 K/uL Final  . RBC 04/04/2016 5.09  4.22 - 5.81 MIL/uL Final  . Hemoglobin 04/04/2016 14.9  13.0 - 17.0 g/dL Final  . HCT 04/04/2016 45.4  39.0 - 52.0 % Final  . MCV 04/04/2016 89.2  78.0 - 100.0 fL Final  . MCH 04/04/2016 29.3  26.0 - 34.0 pg Final  . MCHC 04/04/2016 32.8  30.0 - 36.0 g/dL Final  . RDW 04/04/2016 12.8  11.5 - 15.5 % Final  . Platelets 04/04/2016 239  150 - 400 K/uL Final  . MRSA, PCR 04/04/2016 NEGATIVE  NEGATIVE Final  . Staphylococcus aureus 04/04/2016 NEGATIVE  NEGATIVE Final   Comment:        The Xpert SA Assay (FDA approved for NASAL specimens in patients over 36 years of age), is one component of a comprehensive surveillance program.  Test performance has been validated by [facility] for patients greater than or equal to 61 year old. It is not intended to diagnose infection nor to guide or monitor treatment.   . ABO/RH(D) 04/05/2016 B POS   Final  . Antibody Screen 04/05/2016 NEG   Final  . Sample Expiration 04/05/2016 04/08/2016   Final  .  Extend sample reason 04/05/2016 NO TRANSFUSIONS OR PREGNANCY IN THE PAST 3 MONTHS   Final  . ABO/RH(D) 04/04/2016 B POS   Final    Recent Labs  04/04/16 0845  HGB 14.9    Recent Labs  04/04/16 0845  WBC 6.7  RBC 5.09  HCT 45.4  PLT 239    Recent Labs  04/04/16 0845  NA 137  K 4.4  CL 104  CO2 27  BUN 20  CREATININE 1.07  GLUCOSE 112*  CALCIUM 9.9   No results for input(s): LABPT, INR in the last 72 hours.  X-Rays:Dg Lumbar Spine 2-3 Views  Addendum Date: 04/05/2016   ADDENDUM REPORT: 04/05/2016 13:41 ADDENDUM: Study re-evaluate with the lumbar spine labeled in the AP view. Electronically Signed    By: Lajean Manes M.D.   On: 04/05/2016 13:41   Result Date: 04/05/2016 CLINICAL DATA:  preop HNP; patient standing for both AP and lateral; please number vertebrae EXAM: LUMBAR SPINE - 2-3 VIEW COMPARISON:  01/28/2016 FINDINGS: No fracture. Minimal anterolisthesis of L4 on L5, unchanged. No other spondylolisthesis. Mild loss disc height at L1-L2 with moderate loss disc height L4-L5, stable from the prior exam. Small endplate osteophytes noted throughout the lumbar spine. Soft tissues are unremarkable. IMPRESSION: 1. Degenerative changes as detailed without change from the earlier exam. No fracture or acute finding. Electronically Signed: By: Lajean Manes M.D. On: 04/04/2016 10:06   Dg Spine Portable 1 View  Result Date: 04/05/2016 CLINICAL DATA:  Micro decompression, L4-5 EXAM: PORTABLE SPINE - 1 VIEW COMPARISON:  04/05/2016 FINDINGS: Posterior surgical instruments are directed at the L4-5 level. IMPRESSION: Intraoperative localization as above. Electronically Signed   By: Rolm Baptise M.D.   On: 04/05/2016 14:13   Dg Spine Portable 1 View  Result Date: 04/05/2016 CLINICAL DATA:  L4-5 decompression. EXAM: PORTABLE SPINE - 1 VIEW COMPARISON:  04/04/2016 FINDINGS: Same numbering scheme used on this film as on the study from 04/04/2016. Soft tissue retractors are noted posteriorly in the lower back, at about the level of the L5 spinous process. Radiopaque surgical probe is positioned with the tip overlying the posterior aspect of the L5 superior endplate. IMPRESSION: Intraoperative localization. Electronically Signed   By: Misty Stanley M.D.   On: 04/05/2016 13:02   Dg Spine Portable 1 View  Result Date: 04/05/2016 CLINICAL DATA:  For lumbar spine decompression EXAM: PORTABLE SPINE - 1 VIEW COMPARISON:  Lumbar spine films of 04/04/2016 FINDINGS: Needles are positioned posteriorly for localization. The more cephalad needle is directed toward the interspinous space at L3-4 with the more caudal needle  directed toward the spinous process posteriorly of L5. The lumbar vertebrae remain in normal alignment. IMPRESSION: Needles for localization are positioned as above. Electronically Signed   By: Ivar Drape M.D.   On: 04/05/2016 11:59   Dg Spine Portable 1 View  Result Date: 04/05/2016 CLINICAL DATA:  Micro lumbar decompression L4-L5 EXAM: PORTABLE SPINE - 1 VIEW COMPARISON:  Portable exam 1120 hours compared to preoperative images of 04/04/2016 and 01/28/2016 FINDINGS: Five lumbar vertebra labeled on prior exams. Metallic probe via dorsal approach projects dorsal to the L5-S1 disc space level. Disc space narrowing L4-L5, less L5-S1. Tissues spreader projects dorsal to L5. IMPRESSION: Dorsal intra-ocular localization of the L5-S1 disc space level. Electronically Signed   By: Lavonia Dana M.D.   On: 04/05/2016 11:53    EKG: Orders placed or performed during the hospital encounter of 04/04/16  . EKG 12 lead  . EKG 12  lead     Hospital Course: Patient was admitted to [facility] and taken to the OR and underwent the above state procedure without complications.  Patient tolerated the procedure well and was later transferred to the recovery room and then to the orthopaedic floor for postoperative care.  They were given PO and IV analgesics for pain control following their surgery.  They were given 24 hours of postoperative antibiotics.   PT was consulted postop to assist with mobility and transfers.  The patient was allowed to be WBAT with therapy and was taught back precautions. Discharge planning was consulted to help with postop disposition and equipment needs.  Patient had a good night on the evening of surgery and started to get up OOB with therapy on day one. Patient was seen in rounds and was ready to go home on day one. Patient reported pain as mild mainly in the incision.  His preop leg pain was gone.  They were given discharge instructions and dressing directions.  They were instructed on  when to follow up in the office with Dr. Tonita Cong.   Diet: Cardiac diet Activity:WBAT Follow-up:in 2 weeks Disposition - Home Discharged Condition: good   Discharge Instructions    Call MD / Call 911    Complete by:  As directed    If you experience chest pain or shortness of breath, CALL 911 and be transported to the hospital emergency room.  If you develope a fever above 101 F, pus (white drainage) or increased drainage or redness at the wound, or calf pain, call your surgeon's office.   Constipation Prevention    Complete by:  As directed    Drink plenty of fluids.  Prune juice may be helpful.  You may use a stool softener, such as Colace (over the counter) 100 mg twice a day.  Use MiraLax (over the counter) for constipation as needed.   Diet - low sodium heart healthy    Complete by:  As directed    Discharge instructions    Complete by:  As directed    Pick up stool softner and laxative for home use following surgery while on pain medications. Do not submerge incision under water. Please use good hand washing techniques while changing dressing each day. Continue to use ice for pain and swelling after surgery. Do not use any lotions or creams on the incision until instructed by your surgeon.   Postoperative Constipation Protocol  Constipation - defined medically as fewer than three stools per week and severe constipation as less than one stool per week.  One of the most common issues patients have following surgery is constipation.  Even if you have a regular bowel pattern at home, your normal regimen is likely to be disrupted due to multiple reasons following surgery.  Combination of anesthesia, postoperative narcotics, change in appetite and fluid intake all can affect your bowels.  In order to avoid complications following surgery, here are some recommendations in order to help you during your recovery period.  Colace (docusate) - Pick up an over-the-counter form of Colace or  another stool softener and take twice a day as long as you are requiring postoperative pain medications.  Take with a full glass of water daily.  If you experience loose stools or diarrhea, hold the colace until you stool forms back up.  If your symptoms do not get better within 1 week or if they get worse, check with your doctor.  Dulcolax (bisacodyl) - Pick up  over-the-counter and take as directed by the product packaging as needed to assist with the movement of your bowels.  Take with a full glass of water.  Use this product as needed if not relieved by Colace only.   MiraLax (polyethylene glycol) - Pick up over-the-counter to have on hand.  MiraLax is a solution that will increase the amount of water in your bowels to assist with bowel movements.  Take as directed and can mix with a glass of water, juice, soda, coffee, or tea.  Take if you go more than two days without a movement. Do not use MiraLax more than once per day. Call your doctor if you are still constipated or irregular after using this medication for 7 days in a row.  If you continue to have problems with postoperative constipation, please contact the office for further assistance and recommendations.  If you experience "the worst abdominal pain ever" or develop nausea or vomiting, please contact the office immediatly for further recommendations for treatment.   Do not sit on low chairs, stoools or toilet seats, as it may be difficult to get up from low surfaces    Complete by:  As directed    Driving restrictions    Complete by:  As directed    No driving until released by the physician.   Increase activity slowly as tolerated    Complete by:  As directed    Lifting restrictions    Complete by:  As directed    No lifting until released by the physician.   Patient may shower    Complete by:  As directed    You may shower without a dressing once there is no drainage.  Do not wash over the wound.  If drainage remains, do not shower  until drainage stops.   TED hose    Complete by:  As directed    Use stockings (TED hose) for 3 weeks on both leg(s).  You may remove them at night for sleeping.   Weight bearing as tolerated    Complete by:  As directed    Laterality:  bilateral   Extremity:  Lower       Medication List    TAKE these medications   amLODipine-valsartan 10-320 MG tablet Commonly known as:  EXFORGE Take 1 tablet by mouth daily.   docusate sodium 100 MG capsule Commonly known as:  COLACE Take 1 capsule (100 mg total) by mouth 2 (two) times daily.   gemfibrozil 600 MG tablet Commonly known as:  LOPID Take 1 tablet by mouth 2 (two) times daily.   labetalol 200 MG tablet Commonly known as:  NORMODYNE Take 1 tablet by mouth 2 (two) times daily.   methocarbamol 500 MG tablet Commonly known as:  ROBAXIN Take 1 tablet (500 mg total) by mouth every 6 (six) hours as needed for muscle spasms.   oxyCODONE-acetaminophen 5-325 MG tablet Commonly known as:  PERCOCET/ROXICET Take 1-2 tablets by mouth every 4 (four) hours as needed for moderate pain.   polyethylene glycol packet Commonly known as:  MIRALAX / GLYCOLAX Take 17 g by mouth daily as needed for mild constipation.      Follow-up Information    BEANE,JEFFREY C, MD Follow up in 2 week(s).   Specialty:  Orthopedic Surgery Contact information: [address] [place] [address] [phone]           Signed: Arlee Muslim, PA-C Orthopaedic Surgery 04/06/2016, 8:39 AM

## 2016-04-06 NOTE — Discharge Instructions (Signed)

## 2016-04-06 NOTE — Progress Notes (Signed)
   04/06/16 1200  PT Time Calculation  PT Start Time (ACUTE ONLY) 0915  PT Stop Time (ACUTE ONLY) 0952  PT Time Calculation (min) (ACUTE ONLY) 37 min  PT G-Codes **NOT FOR INPATIENT CLASS**  Functional Assessment Tool Used Clinical judgement  Functional Limitation Mobility: Walking and moving around  Mobility: Walking and Moving Around Current Status (Z6109(G8978) CI  Mobility: Walking and Moving Around Goal Status (U0454(G8979) CI  Mobility: Walking and Moving Around Discharge Status (U9811(G8980) CI  PT General Charges  $$ ACUTE PT VISIT 1 Procedure  PT Evaluation  $PT Eval Low Complexity 1 Procedure  PT Treatments  $Gait Training 8-22 mins

## 2016-04-06 NOTE — Evaluation (Signed)
Occupational Therapy Evaluation Patient Details Name: Collin Lloyd MRN: [id number] DOB: 01/13/1976 Today's Date: 04/06/2016    History of Present Illness s/p L4-5 decompression   Clinical Impression   This 40 year old man was admitted for the above sx. All education was completed. No further OT is needed at this time    Follow Up Recommendations  No OT follow up;Supervision/Assistance - 24 hour    Equipment Recommendations  3 in 1 bedside comode    Recommendations for Other Services       Precautions / Restrictions Precautions Precautions: Back Restrictions Weight Bearing Restrictions: No      Mobility Bed Mobility Overal bed mobility: Needs Assistance Bed Mobility: Sit to Sidelying         Sit to sidelying: Min assist General bed mobility comments: light assist for bil LEs  Transfers Overall transfer level: Needs assistance Equipment used: Rolling walker (2 wheeled) Transfers: Sit to/from Stand Sit to Stand: Min guard         General transfer comment: physical cues to keep back straight during sit to stand    Balance                                            ADL Overall ADL's : Needs assistance/impaired     Grooming: Set up;Standing;Supervision/safety   Upper Body Bathing: Supervision/ safety;Set up;Sitting   Lower Body Bathing: Moderate assistance;Sit to/from stand   Upper Body Dressing : Set up;Supervision/safety;Sitting   Lower Body Dressing: Maximal assistance;Sit to/from stand   Toilet Transfer: Min guard;Ambulation   Toileting- Clothing Manipulation and Hygiene: Minimal assistance;Sit to/from stand         General ADL Comments: educated pt on reacher for putting pants on and long sponge to wash feet.  May need toilet aide also.  Educated on back precautions for adls.  Pt/wife verbalize understanding     Vision     Perception     Praxis      Pertinent Vitals/Pain Pain Assessment: 0-10 Pain Score: 6   Pain Location: back Pain Descriptors / Indicators: Aching Pain Intervention(s): Limited activity within patient's tolerance;Premedicated before session;Monitored during session;Repositioned     Hand Dominance     Extremity/Trunk Assessment Upper Extremity Assessment Upper Extremity Assessment: Overall WFL for tasks assessed           Communication Communication Communication: No difficulties   Cognition Arousal/Alertness: Awake/alert Behavior During Therapy: WFL for tasks assessed/performed Overall Cognitive Status: Within Functional Limits for tasks assessed                     General Comments       Exercises       Shoulder Instructions      Home Living Family/patient expects to be discharged to:: Private residence Living Arrangements: Spouse/significant other Available Help at Discharge: Family               Bathroom Shower/Tub: Chief Strategy Officer: Standard                Prior Functioning/Environment Level of Independence: Independent             OT Diagnosis: Acute pain   OT Problem List:     OT Treatment/Interventions:      OT Goals(Current goals can be found in the care plan section) Acute Rehab OT Goals  Patient Stated Goal: back to heal OT Goal Formulation: All assessment and education complete, DC therapy  OT Frequency:     Barriers to D/C:            Co-evaluation              End of Session    Activity Tolerance: Patient tolerated treatment well Patient left: in bed;with call bell/phone within reach;with family/visitor present   Time: [id number] OT Time Calculation (min): 18 min Charges:  OT General Charges $OT Visit: 1 Procedure OT Evaluation $OT Eval Low Complexity: 1 Procedure G-Codes: OT G-codes **NOT FOR INPATIENT CLASS** Functional Assessment Tool Used: clinical observation and judgment Functional Limitation: Self care Self Care Current Status (G4010(G8987): At least 40 percent but  less than 60 percent impaired, limited or restricted Self Care Goal Status (U7253(G8988): At least 40 percent but less than 60 percent impaired, limited or restricted Self Care Discharge Status [phone](G8989): At least 40 percent but less than 60 percent impaired, limited or restricted  [facility] CenterENCER,Wetona Viramontes 04/06/2016, 11:41 AM  Marica OtterMaryellen Lubertha Leite, OTR/L [phone] 04/06/2016

## 2016-04-06 NOTE — Care Management Note (Addendum)
Case Management Note  Patient Details  Name: Collin Lloyd MRN: [id number] Date of Birth: 07-Apr-1976  Subjective/Objective:                  MICRO LUMBAR DECOMPRESSION L4 - L5 ON THE LEFT (Left) Action/Plan: Discharge planning Expected Discharge Date:                  Expected Discharge Plan:  Home/Self Care  In-House Referral:  NA  Discharge planning Services  CM Consult  Post Acute Care Choice:  Durable Medical Equipment Choice offered to:  Patient  DME Arranged:  Walker rolling DME Agency:  NA  HH Arranged:  NA HH Agency:  NA  Status of Service:  Completed, signed off  If discussed at [employer] of Stay Meetings, dates discussed:    Additional Comments: 15:10 CM called [facility] and spoke with Shanon Brow concerning this pt and he states delivery ticket has been generated and will call Manuela Schwartz to arrange.  CM gavbe David Susan's number.  CM texted Manuela Schwartz notifying her of David's message and Manuela Schwartz has responded she is waiting for authorization to deliver.  CM has updated pt to where [facility] is in the process.  No other CM needs were communicated. 14:30 CM has placed call to [facility] rep, Manuela Schwartz who states she has not received "anything" on this pt for DME.   11:20 Lindell Noe is in pt's room and has all information to arrange for rolling walker.  CM will continue to follow to ensure pt receives rolling walker prior to discharge. CM met with pt who gave me contact for Workers [employer], Lindell Noe [phone].  Cm called Levada Dy who requested I fax order for rolling walker, facesheet, H&P, and last progress note to [phone].  Despite this CM receiving confirmation of receipt, Levada Dy states she will come to pt's room to pick up requested information.  Pt has no follow [place] services recc or ordered.  Cm will continue to follow to ensure pt gets rolling prior to discharge.  Dellie Catholic, RN 04/06/2016, 11:04 AM

## 2016-04-06 NOTE — Evaluation (Signed)
Physical Therapy Evaluation Patient Details Name: Collin OysterDerrick L Centanni MRN: [id number] DOB: Nov 23, 1975 Today's Date: 04/06/2016   History of Present Illness  s/p L4-5 decompression  Clinical Impression  Pt s/p back surgery and presents with functional mobility limitations 2* post op pain and back restrictions.  Pt plans dc to home with family assist    Follow Up Recommendations No PT follow up    Equipment Recommendations  Rolling walker with 5" wheels    Recommendations for Other Services OT consult     Precautions / Restrictions Precautions Precautions: Back Precaution Booklet Issued: Yes (comment) Restrictions Weight Bearing Restrictions: No      Mobility  Bed Mobility Overal bed mobility: Needs Assistance Bed Mobility: Supine to Sit;Sit to Supine     Supine to sit: Min guard Sit to supine: Min guard Sit to sidelying: Min assist General bed mobility comments: cues for sequence and correct log roll technique  Transfers Overall transfer level: Needs assistance Equipment used: Rolling walker (2 wheeled) Transfers: Sit to/from Stand Sit to Stand: Min guard;Supervision         General transfer comment: physical cues to keep back straight during sit to stand  Ambulation/Gait Ambulation/Gait assistance: Min guard;Supervision Ambulation Distance (Feet): 200 Feet Assistive device: Rolling walker (2 wheeled);None Gait Pattern/deviations: Step-through pattern;Decreased step length - right;Decreased step length - left;Shuffle Gait velocity: decr Gait velocity interpretation: Below normal speed for age/gender General Gait Details: cues for posture and postion from RW.  PT ambulated 100' sans AD and 100' with RW - noted increased stability with RW and pt reports decreased back pain  Stairs            Wheelchair Mobility    Modified Rankin (Stroke Patients Only)       Balance                                             Pertinent  Vitals/Pain Pain Assessment: 0-10 Pain Score: 7  Pain Location: back Pain Descriptors / Indicators: Aching;Sore Pain Intervention(s): Limited activity within patient's tolerance;Monitored during session;Premedicated before session;Ice applied    Home Living Family/patient expects to be discharged to:: Private residence Living Arrangements: Spouse/significant other Available Help at Discharge: Family Type of Home: House Home Access: Stairs to enter   Secretary/administratorntrance Stairs-Number of Steps: 1 Home Layout: Able to live on main level with bedroom/bathroom Home Equipment: None      Prior Function Level of Independence: Independent               Hand Dominance        Extremity/Trunk Assessment   Upper Extremity Assessment: Overall WFL for tasks assessed           Lower Extremity Assessment: Overall WFL for tasks assessed      Cervical / Trunk Assessment: Normal  Communication   Communication: No difficulties  Cognition Arousal/Alertness: Awake/alert Behavior During Therapy: WFL for tasks assessed/performed Overall Cognitive Status: Within Functional Limits for tasks assessed                      General Comments      Exercises        Assessment/Plan    PT Assessment    PT Diagnosis Difficulty walking   PT Problem List Decreased activity tolerance;Decreased mobility;Decreased knowledge of use of DME;Decreased safety awareness;Pain  PT Treatment Interventions DME instruction;Gait  training;Stair training;Functional mobility training;Therapeutic activities;Therapeutic exercise;Patient/family education   PT Goals (Current goals can be found in the Care Plan section) Acute Rehab PT Goals Patient Stated Goal: back to heal PT Goal Formulation: With patient Time For Goal Achievement: 04/07/16 Potential to Achieve Goals: Good    Frequency 7X/week   Barriers to discharge        Co-evaluation               End of Session   Activity Tolerance:  Patient tolerated treatment well Patient left: in chair;with call bell/phone within reach;with family/visitor present Nurse Communication: Mobility status         Time: 1610-9604 PT Time Calculation (min) (ACUTE ONLY): 37 min   Charges:   PT Evaluation $PT Eval Low Complexity: 1 Procedure PT Treatments $Gait Training: 8-22 mins   PT G Codes:        Cuca Benassi 2016/04/24, 12:30 PM

## 2016-04-06 NOTE — Progress Notes (Signed)
   Subjective: 1 Day Post-Op Procedure(s) (LRB): MICRO LUMBAR DECOMPRESSION L4 - L5 ON THE LEFT (Left) Patient reports pain as mild mainly in the incision.  His preop leg pain is gone. Patient seen in rounds for Dr. Shelle IronBeane. Patient is well, but has had some minor complaints of pain in the incision, requiring pain medications We will start therapy today.  Needs to get up and walk. Plan is to go Home after hospital stay.  Objective: Vital signs in last 24 hours: Temp:  [97 F (36.1 C)-98.7 F (37.1 C)] 98.5 F (36.9 C) (09/14 0540) Pulse Rate:  [74-105] 85 (09/14 0540) Resp:  [15-22] 17 (09/14 0540) BP: (108-155)/(49-92) 152/69 (09/14 0540) SpO2:  [97 %-100 %] 100 % (09/14 0540) Weight:  [102.5 kg (226 lb)] 102.5 kg (226 lb) (09/13 1700)  Intake/Output from previous day: 09/13 0701 - 09/14 0700 In: 3774.2 [P.O.:1320; I.V.:2254.2; IV Piggyback:200] Out: 2250 [Urine:1875; Emesis/NG output:300; Blood:75] Intake/Output this shift: No intake/output data recorded.   Recent Labs  04/04/16 0845  HGB 14.9    Recent Labs  04/04/16 0845  WBC 6.7  RBC 5.09  HCT 45.4  PLT 239    Recent Labs  04/04/16 0845  NA 137  K 4.4  CL 104  CO2 27  BUN 20  CREATININE 1.07  GLUCOSE 112*  CALCIUM 9.9   No results for input(s): LABPT, INR in the last 72 hours.  EXAM General - Patient is Alert, Appropriate and Oriented Extremity - Neurovascular intact Sensation intact distally to both lower legs Dorsiflexion/Plantar flexion intact good strength on both legs. Dressing - dressing C/D/I Motor Function - intact, moving foot and toes well on exam.   Past Medical History:  Diagnosis Date  . HNP (herniated nucleus pulposus), lumbar   . Hypertension     Assessment/Plan: 1 Day Post-Op Procedure(s) (LRB): MICRO LUMBAR DECOMPRESSION L4 - L5 ON THE LEFT (Left) Active Problems:   Spinal stenosis of lumbar region  Estimated body mass index is 34.36 kg/m as calculated from the  following:   Height as of this encounter: 5\' 8"  (1.727 m).   Weight as of this encounter: 102.5 kg (226 lb). Up with therapy Discharge home with home health D/C O2 and Pulse OX and try on Room Air  Plan home today.  Avel Peacerew Perkins, PA-C Orthopaedic Surgery 04/06/2016, 8:16 AM

## 2016-04-07 NOTE — Op Note (Signed)
NAMLala Lund:  Mankin, Srihaan              ACCOUNT NO.:  [id number]  MEDICAL RECORD NO.:  [id number]  LOCATION:  1606                         [facility]  PHYSICIAN:  Jene EveryJeffrey Sheena Simonis, M.D.    DATE OF BIRTH:  03-17-1976  DATE OF PROCEDURE: DATE OF DISCHARGE:  04/06/2016                              OPERATIVE REPORT   PREOPERATIVE DIAGNOSIS:  Spinal stenosis, herniated nucleus pulposus at L4-5.  POSTOPERATIVE DIAGNOSIS:  Spinal stenosis, herniated nucleus pulposus at L4-5.  PROCEDURE PERFORMED:  Bilateral microlumbar decompression, L4-5, with bilateral foraminotomies, L4 and L5; microdiskectomy, 4-5.  ANESTHESIA:  General.  ASSISTANT:  Skip MayerBlair Roberts, PA.  HISTORY:  This is a pleasant gentleman with predominantly quad weakness on the left secondary to foraminal disk herniation, 4-5.  He had significant atrophy, diminished knee reflex and a quad weakness.  MRI indicating foraminal disk herniation at 4-5, associated disk degeneration.  He did have some foraminal stenosis at 4-5 on the right. Predominant symptoms on the left.  Preoperatively noted that based upon the position, he would lay and he would develop right lower extremity radicular pain.  We discussed predominantly microlumbar decompression at L4-5.  Risks and benefits were discussed including bleeding, infection, damage to neurovascular structures, CSF leakage, epidural fibrosis, adjacent segment disease, need for fusion in the future, DVT, PE, anesthetic complications, etc.  TECHNIQUE:  With the patient in supine position, after induction of adequate general anesthesia, 2 g of Kefzol, placed prone on the [place] frame.  All bony prominences were well padded.  Lumbar region was prepped and draped in usual sterile fashion.  Two 18-gauge spinal needles were utilized to localize the 4-5 interspace.  Initial confirmation by x-ray was delayed due to the inner-operation of the x- ray machine.  We did proceed with our time-out for  the patient, site and symptoms, etc.  After the x-ray was in operation, we obtained an x-ray confirming L4-5.  We also had a challenge in having the operative levels labeled.  Following this, incision was made at the spinous process of 4- 5.  Subcutaneous tissue was dissected, electrocautery was utilized to achieve hemostasis.  Dorsolumbar fascia was divided in line with the skin incision initially on either side of the interspinous ligament.  I placed a Insurance claims handlerMcCullough retractor.  Operating microscope was draped and brought on the surgical field.  Three small interlaminar windows were noted on bedsides here.  The hemilaminotomy on the caudad edge of 4 was performed with a 3-mm Kerrison.  I used an osteotome to remove the inferior aspect of 4, medial aspect on the left.  Ligamentum flavum detached from the cephalad edge of 5, utilizing a straight curette. Hypertrophic ligamentum flavum was noted.  I did remove portion of the interspinous ligament as the interlaminar window was actually absent till near the midline.  I performed a laminotomy after the patient had neuro patty beneath the ligamentum flavum, performed a generous foraminotomy of 5.  There was significant compression of the 5 root in the lateral recess.  We gently mobilized the 5 root medially, decompressed the lateral recess at the medial border of the pedicle.  We performed foraminotomy of 4, large osteophyte in the foramen was noted. There  was a small focal HNP at 4-5 here.  This was retrieved with a micropituitary sent to Pathology.  I made annulotomy and small portion of disk material was removed from the disk space, copiously irrigated with antibiotic irrigation.  I explored significantly up to the pedicle of 4 medially.  Neuroprobe passed freely at the foramen of 4 and of 5. No significant compression of the 5 root was noted.  Epidural fat was noted.  Epidural venous plexus was encountered and this was  cauterized. Confirmatory radiograph obtained at 4-5.  Copiously irrigated the wound. No evidence of CSF leakage or active bleeding was noted.  The bone was fairly eburnated and we had a micropituitary and a 3-mm Kerrison bowed and inoperative that we had to wait for instruments from downstairs. Copiously irrigated the wound and closed the dorsolumbar fascia on the left.  Skip Mayer was closing on the right.  I moved to the opposite side of the operating room table, digital palpation.  After further consideration, I felt that the pathology on the left was not generally consistent with that seen on the MRI, made a large foraminal disk herniation perhaps it had migrated.  Intermittent symptoms on the right and stenosis also noted on the right.  I felt prudent to explore the right side just to ensure that the rupture had not migrated to the contralateral side.  I had on the left, checked beneath the thecal sac, the axilla and the shoulder root, did not see any further ruptured material.  In a similar fashion, I performed hemilaminotomy, caudad edge of 4, cephalad edge of 5, detached the ligamentum flavum with a straight curette.  There was significant stenosis actually here on the right as well.  Decompressed the lateral recess to medial border of the pedicle, there was a disk herniation noted here on the right, I actually felt was significant.  I did perform a foraminotomy and an annulotomy, and I removed a large fragment from actually on the right.  After this, a neural probe passed freely up the foramen.  It was tight prior to that, no active bleeding or CSF leakage, irrigated the disk space with antibiotic irrigation.  From this side at the operating room table, I had also checked the foramen on the left as well.  I fully exploring that and I felt visualized pathology had been addressed.  Again, I checked beneath the thecal sac, the axilla, shoulder of the root, no residual  compression.  We had 1-cm of excursion at both 5 roots medial and pedicle without tension.  Copiously irrigated the disk space. Again, did not have localization by x-ray, awaited period of time to have the radiologist read the final localization film, which was at 4-5. We then removed the [facility], copiously irrigated the wound.  I had given him extra 900 mg of clindamycin during the case.  We closed the dorsolumbar fascia on the right with 1 Vicryl, subcu with 2-0, and skin with staples.  Wound was dressed sterilely.  Placed supine on the hospital bed, extubated without difficulty, and transported to the recovery room in satisfactory condition.  The patient tolerated the procedure well.  No complications.  Minimal blood loss.  Assistant, Skip Mayer, PA, who was there throughout the case for patient positioning, gentle intermittent neural traction, positioning and closure.     Jene Every, M.D.     Cordelia Pen  D:  04/06/2016  T:  04/07/2016  Job:  [id number]

## 2018-01-19 IMAGING — DX DG SPINE 1V PORT
1 series · 1 of 1 positions shown · non-contrast
Comparison: Portable exam 0056 hours compared to preoperative
images of 04/04/2016 and 01/28/2016

CLINICAL DATA: Micro lumbar decompression L4-L5

EXAM:
PORTABLE SPINE - 1 VIEW

[l-spine lat]
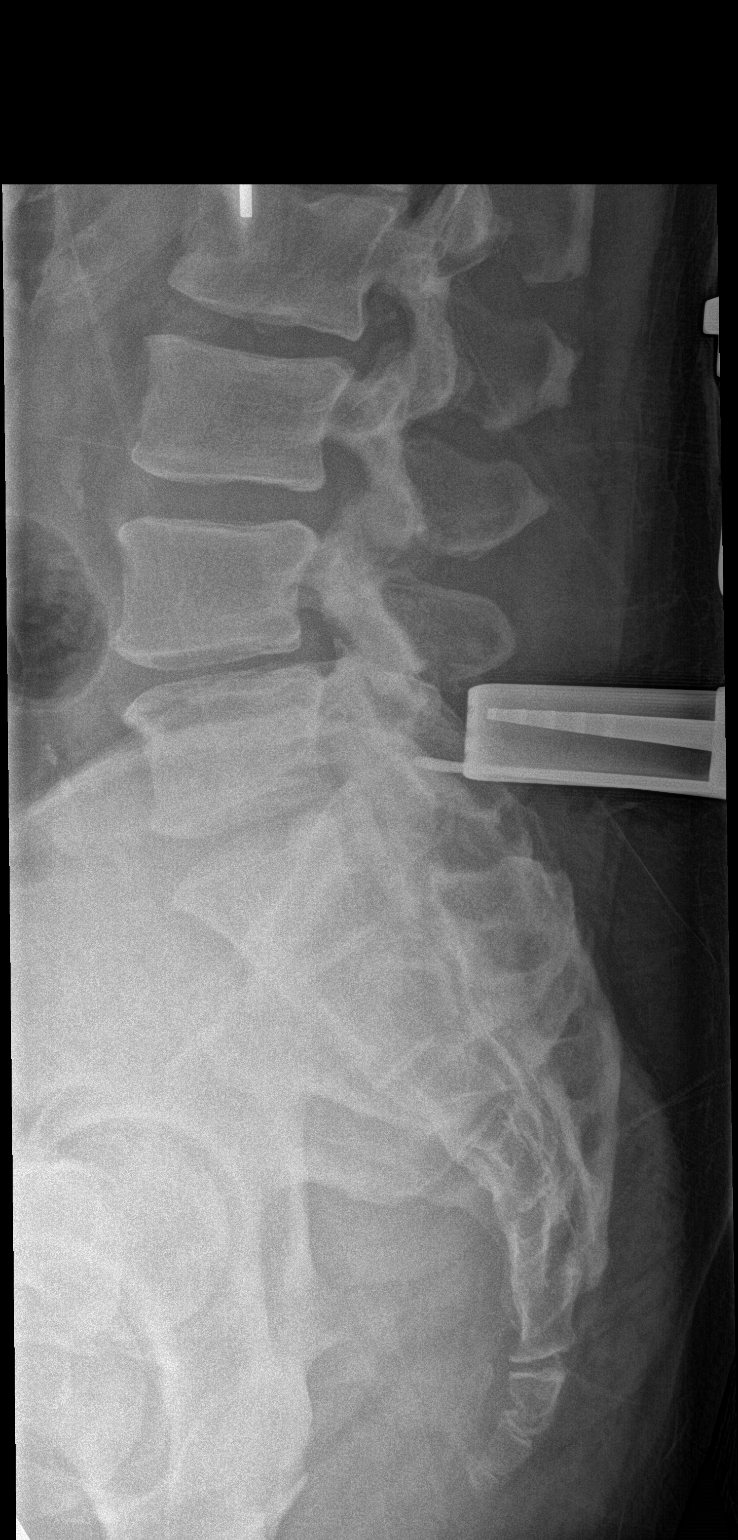

[1 of 1 positions shown; findings below may reference images not displayed]

FINDINGS: Five lumbar vertebra labeled on prior exams.

Metallic probe via dorsal approach projects dorsal to the L5-S1 disc
space level.

Disc space narrowing L4-L5, less L5-S1.

Tissues spreader projects dorsal to L5.
IMPRESSION: Dorsal intra-ocular localization of the L5-S1 disc space level.

## 2018-01-19 IMAGING — DX DG SPINE 1V PORT
1 series · 1 of 1 positions shown · non-contrast
Comparison: 04/05/2016

CLINICAL DATA: Micro decompression, L4-5

EXAM:
PORTABLE SPINE - 1 VIEW

[l-spine lat]
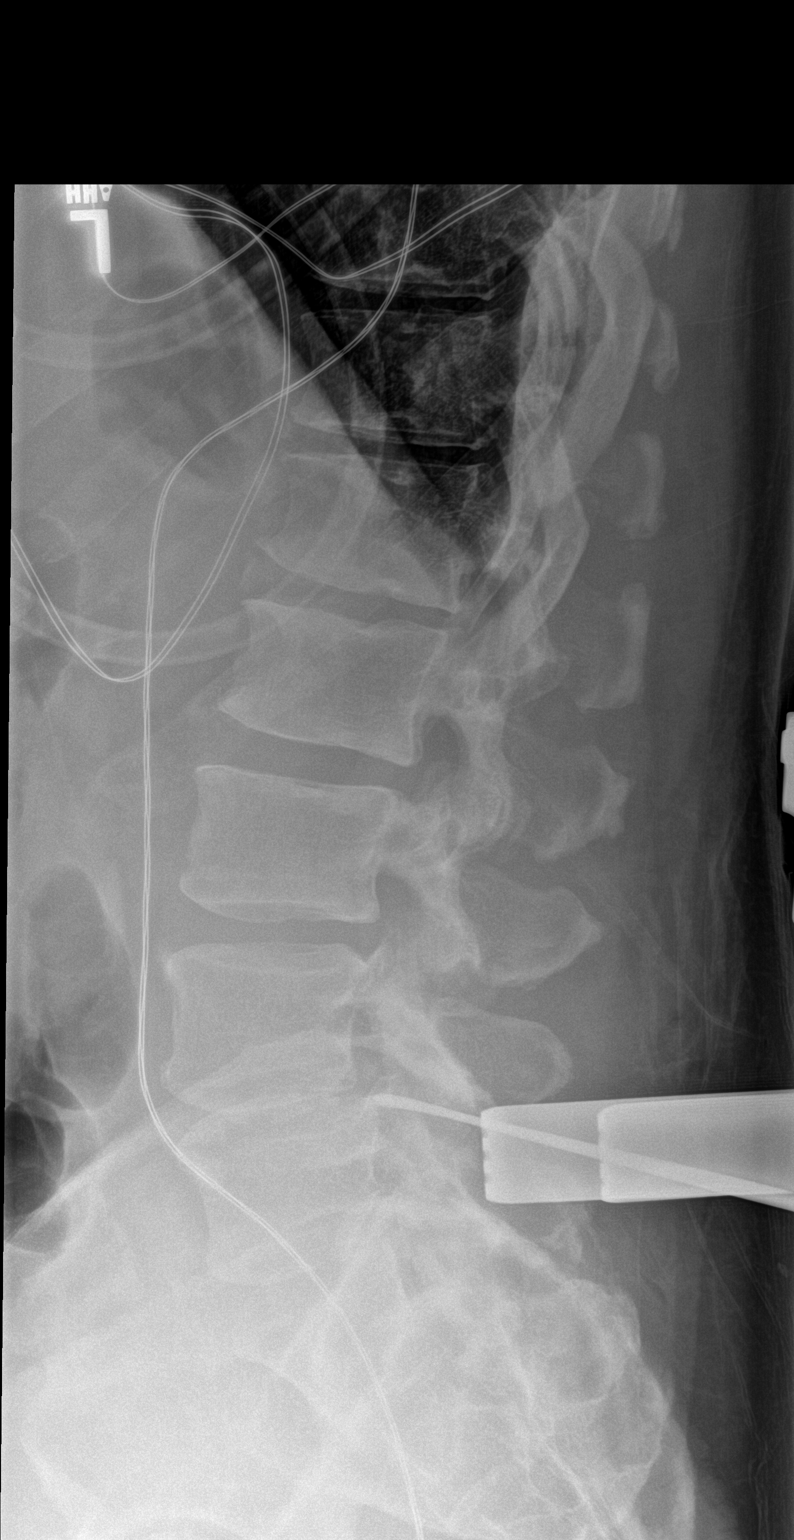

[1 of 1 positions shown; findings below may reference images not displayed]

FINDINGS: Posterior surgical instruments are directed at the L4-5 level.
IMPRESSION: Intraoperative localization as above.

## 2018-01-19 IMAGING — DX DG SPINE 1V PORT
1 series · 1 of 1 positions shown · non-contrast
Comparison: 04/04/2016

CLINICAL DATA: L4-5 decompression.

EXAM:
PORTABLE SPINE - 1 VIEW

[l-spine lat]
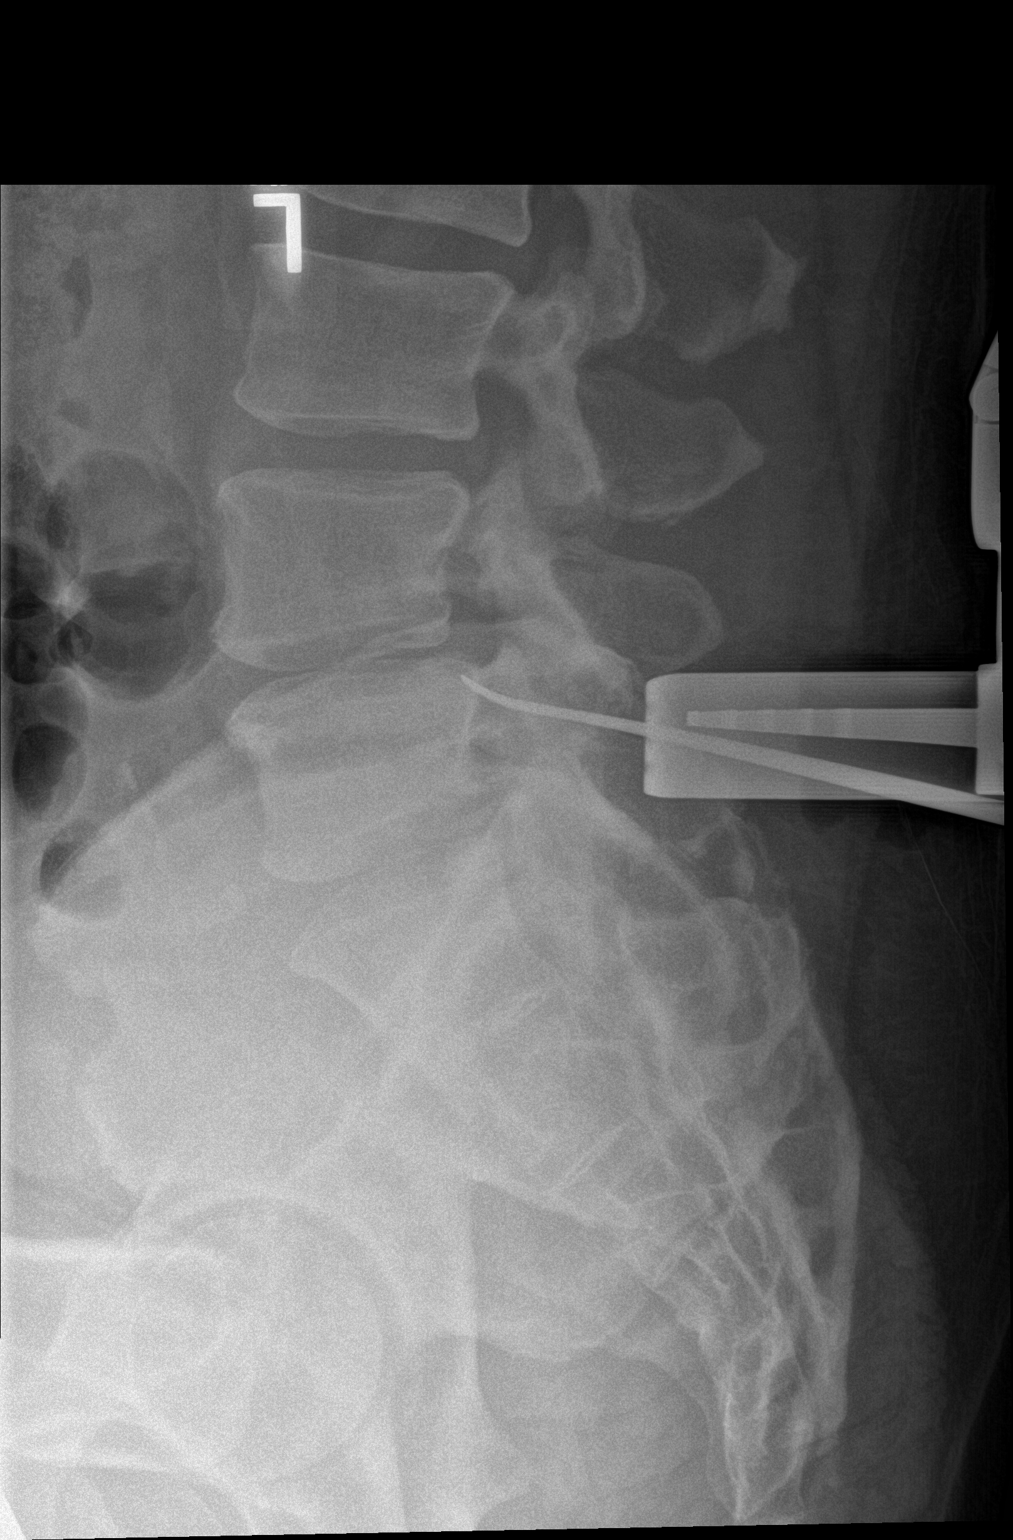

[1 of 1 positions shown; findings below may reference images not displayed]

FINDINGS: Same numbering scheme used on this film as on the study from
04/04/2016. Soft tissue retractors are noted posteriorly in the
lower back, at about the level of the L5 spinous process. Radiopaque
surgical probe is positioned with the tip overlying the posterior
aspect of the L5 superior endplate.
IMPRESSION: Intraoperative localization.
